# Patient Record
Sex: Female | Born: 1990 | Race: White | Hispanic: No | Marital: Single | Smoking: Never smoker
Health system: Southern US, Community
[De-identification: ages and names within clinical notes are randomized; demographics above are authoritative.]

## PROBLEM LIST (undated history)

## (undated) DIAGNOSIS — R102 Pelvic and perineal pain: Secondary | ICD-10-CM

## (undated) DIAGNOSIS — E0500 Thyrotoxicosis with diffuse goiter without thyrotoxic crisis or storm: Secondary | ICD-10-CM

## (undated) DIAGNOSIS — J45909 Unspecified asthma, uncomplicated: Secondary | ICD-10-CM

## (undated) DIAGNOSIS — E282 Polycystic ovarian syndrome: Secondary | ICD-10-CM

## (undated) HISTORY — PX: LAPAROSCOPIC ABDOMINAL EXPLORATION: SHX6249

---

## 2013-08-13 ENCOUNTER — Emergency Department: Payer: Self-pay | Admitting: Emergency Medicine

## 2013-08-13 LAB — URINALYSIS, COMPLETE
BILIRUBIN, UR: NEGATIVE
BLOOD: NEGATIVE
Glucose,UR: NEGATIVE mg/dL (ref 0–75)
KETONE: NEGATIVE
Leukocyte Esterase: NEGATIVE
NITRITE: NEGATIVE
PH: 6 (ref 4.5–8.0)
Protein: NEGATIVE
SPECIFIC GRAVITY: 1.006 (ref 1.003–1.030)
Squamous Epithelial: 2
WBC UR: 2 /HPF (ref 0–5)

## 2013-08-13 LAB — BASIC METABOLIC PANEL
Anion Gap: 6 — ABNORMAL LOW (ref 7–16)
BUN: 10 mg/dL (ref 7–18)
Calcium, Total: 9.2 mg/dL (ref 8.5–10.1)
Chloride: 107 mmol/L (ref 98–107)
Co2: 23 mmol/L (ref 21–32)
Creatinine: 0.91 mg/dL (ref 0.60–1.30)
EGFR (Non-African Amer.): >60
Glucose: 75 mg/dL (ref 65–99)
Osmolality: 270 (ref 275–301)
POTASSIUM: 4.1 mmol/L (ref 3.5–5.1)
SODIUM: 136 mmol/L (ref 136–145)

## 2013-08-13 LAB — CBC
HCT: 40.2 % (ref 35.0–47.0)
HGB: 13.2 g/dL (ref 12.0–16.0)
MCH: 29 pg (ref 26.0–34.0)
MCHC: 32.8 g/dL (ref 32.0–36.0)
MCV: 89 fL (ref 80–100)
Platelet: 241 10*3/uL (ref 150–440)
RBC: 4.55 10*6/uL (ref 3.80–5.20)
RDW: 12.1 % (ref 11.5–14.5)
WBC: 10.2 10*3/uL (ref 3.6–11.0)

## 2013-08-13 LAB — TROPONIN I: Troponin-I: <0.02 ng/mL

## 2013-09-27 ENCOUNTER — Ambulatory Visit: Payer: Self-pay | Admitting: Internal Medicine

## 2014-03-04 ENCOUNTER — Emergency Department: Payer: Self-pay | Admitting: Emergency Medicine

## 2014-03-04 LAB — COMPREHENSIVE METABOLIC PANEL
ALT: 26 U/L
Albumin: 3.5 g/dL (ref 3.4–5.0)
Alkaline Phosphatase: 37 U/L — ABNORMAL LOW
Anion Gap: 7 (ref 7–16)
BUN: 15 mg/dL (ref 7–18)
Bilirubin,Total: 0.3 mg/dL (ref 0.2–1.0)
CALCIUM: 9.4 mg/dL (ref 8.5–10.1)
CHLORIDE: 105 mmol/L (ref 98–107)
CO2: 25 mmol/L (ref 21–32)
Creatinine: 1.21 mg/dL (ref 0.60–1.30)
GFR CALC NON AF AMER: 59 — AB
Glucose: 84 mg/dL (ref 65–99)
Osmolality: 274 (ref 275–301)
Potassium: 4.1 mmol/L (ref 3.5–5.1)
SGOT(AST): 15 U/L (ref 15–37)
Sodium: 137 mmol/L (ref 136–145)
Total Protein: 8.9 g/dL — ABNORMAL HIGH (ref 6.4–8.2)

## 2014-03-04 LAB — CBC WITH DIFFERENTIAL/PLATELET
BASOS ABS: 0 10*3/uL (ref 0.0–0.1)
Basophil %: 0.3 %
Eosinophil #: 0.2 10*3/uL (ref 0.0–0.7)
Eosinophil %: 1.6 %
HCT: 41 % (ref 35.0–47.0)
HGB: 13.5 g/dL (ref 12.0–16.0)
LYMPHS ABS: 3.3 10*3/uL (ref 1.0–3.6)
LYMPHS PCT: 28.8 %
MCH: 29.7 pg (ref 26.0–34.0)
MCHC: 33 g/dL (ref 32.0–36.0)
MCV: 90 fL (ref 80–100)
Monocyte #: 0.6 x10 3/mm (ref 0.2–0.9)
Monocyte %: 5.1 %
NEUTROS ABS: 7.3 10*3/uL — AB (ref 1.4–6.5)
NEUTROS PCT: 64.2 %
Platelet: 295 10*3/uL (ref 150–440)
RBC: 4.55 10*6/uL (ref 3.80–5.20)
RDW: 12.3 % (ref 11.5–14.5)
WBC: 11.3 10*3/uL — ABNORMAL HIGH (ref 3.6–11.0)

## 2014-03-04 LAB — URINALYSIS, COMPLETE
Bacteria: NONE SEEN
Bilirubin,UR: NEGATIVE
GLUCOSE, UR: NEGATIVE mg/dL (ref 0–75)
Ketone: NEGATIVE
LEUKOCYTE ESTERASE: NEGATIVE
NITRITE: NEGATIVE
PH: 5 (ref 4.5–8.0)
Protein: 30
RBC,UR: 1 /HPF (ref 0–5)
Specific Gravity: 1.019 (ref 1.003–1.030)
Squamous Epithelial: <1
WBC UR: 1 /HPF (ref 0–5)

## 2014-03-31 ENCOUNTER — Ambulatory Visit: Payer: Self-pay | Admitting: Obstetrics and Gynecology

## 2014-03-31 LAB — BASIC METABOLIC PANEL
Anion Gap: 8 (ref 7–16)
BUN: 11 mg/dL (ref 7–18)
CO2: 25 mmol/L (ref 21–32)
Calcium, Total: 9.2 mg/dL (ref 8.5–10.1)
Chloride: 106 mmol/L (ref 98–107)
Creatinine: 1.02 mg/dL (ref 0.60–1.30)
EGFR (African American): >60
GLUCOSE: 88 mg/dL (ref 65–99)
Osmolality: 276 (ref 275–301)
POTASSIUM: 3.8 mmol/L (ref 3.5–5.1)
Sodium: 139 mmol/L (ref 136–145)

## 2014-03-31 LAB — HEMOGLOBIN: HGB: 12.2 g/dL (ref 12.0–16.0)

## 2014-04-04 ENCOUNTER — Ambulatory Visit: Payer: Self-pay | Admitting: Obstetrics and Gynecology

## 2014-05-09 ENCOUNTER — Ambulatory Visit: Payer: Self-pay | Admitting: Gastroenterology

## 2014-06-03 ENCOUNTER — Ambulatory Visit
Admit: 2014-06-03 | Disposition: A | Discharge status: Home / Self Care | Payer: Self-pay | Attending: Physician Assistant | Admitting: Physician Assistant

## 2014-06-03 ENCOUNTER — Ambulatory Visit
Admit: 2014-06-03 | Disposition: A | Discharge status: Home / Self Care | Payer: Self-pay | Attending: Family Medicine | Admitting: Family Medicine

## 2014-06-23 LAB — SURGICAL PATHOLOGY

## 2014-06-29 NOTE — Op Note (Signed)
PATIENT NAME:  Virginia Solis, Virginia Solis MR#:  [id number] DATE OF BIRTH:  04-02-1990  DATE OF PROCEDURE:  04/04/2014  PREOPERATIVE DIAGNOSIS: Pelvic pain.  POSTOPERATIVE DIAGNOSIS: Pelvic pain.  PROCEDURES PERFORMED:  1.  Diagnostic laparoscopy.  2.  Biopsy of right broad ligament.   SURGEON:  Jolinda Croakavid Goodman, MD  ANESTHESIA: General endotracheal.  ESTIMATED BLOOD LOSS: 5 mL.   URINE OUTPUT: 150 mL.   COMPLICATIONS: None.   FINDINGS: Uterus sounded to 7 cm. Benign-appearing right and left upper quadrants. Pelvic survey found hypervascularity of the anterior cul-de-sac. The right tube and ovary were benign-appearing and free of adhesive disease, normally sized. The left tube and ovary were benign-appearing and free of adhesive disease and normally sized. Both ovaries were mobile. The posterior cul-de-sac had no evidence of abnormality to speak up. Particularly the right anterior broad ligament into the right cornu were hypervascular without specific endometriotic lesions that could be excised being noted.   SPECIMEN: Biopsy of right broad ligament.   CONDITION: Stable.   DESCRIPTION OF PROCEDURE: After obtaining appropriate consent, the patient was taken to the operating room where she was placed under general anesthesia without complication. Time out was taken to insure the correct patient, the correct procedure, that her allergies were identified and prophylactic antibiotics were not required.   An open-sided speculum was placed and her cervix was identified. The uterus sounded to 7 cm. A Hulka tenaculum was placed atraumatically. Instruments were removed from the vagina and the surgeon's gloves were changed.   The umbilicus was infiltrated with 5 mL of Sensorcaine. A 5 mm incision was made in the inferior aspect of the umbilicus using an 11 blade. The abdomen was then retracted upward and a 5 mm trocar was placed atraumatically under direct visualization. Survey of the pelvis and upper abdomen  were completed and noted the findings. Next, decision was made to take a biopsy of the right broad ligament. Proper placement for the left trocar was identified. The incision site was infiltrated with 5 mL of Sensorcaine. A 5 mm incision was made and trocar was placed without complication. Next, a biopsy was taken using the biopsy forceps and the area was made hemostatic using electrosurgery. The site was irrigated and found to be hemostatic. A small amount of fluid for irrigation was sucked out using a syringe.   At this point, the procedure was terminated. The abdomen was deflated and the trocars were removed. The incisions were closed with 4-0 Monocryl and Dermabond. The Hulka tenaculum was removed and the patient was awoken from anesthesia without complication. [facility] and will be discharged home in stable condition.   ____________________________ Teena Iraniavid M. Derrill KayGoodman, MD :Terrance Masssb D: 04/04/2014 13:11:54 ET T: 04/04/2014 13:51:00 ET JOB#: [id number]  cc: Teena Iraniavid M. Derrill KayGoodman, MD, <Dictator> DAVID Farrel ConnersM GOODMAN MD ELECTRONICALLY SIGNED 04/23/2014 20:20

## 2015-05-12 ENCOUNTER — Ambulatory Visit
Admission: EM | Admit: 2015-05-12 | Discharge: 2015-05-12 | Disposition: A | Discharge status: Home / Self Care | Payer: Worker's Compensation | Attending: Family Medicine | Admitting: Family Medicine

## 2015-05-12 DIAGNOSIS — T23001A Burn of unspecified degree of right hand, unspecified site, initial encounter: Secondary | ICD-10-CM

## 2015-05-12 HISTORY — DX: Unspecified asthma, uncomplicated: J45.909

## 2015-05-12 HISTORY — DX: Polycystic ovarian syndrome: E28.2

## 2015-05-12 HISTORY — DX: Pelvic and perineal pain: R10.2

## 2015-05-12 HISTORY — DX: Thyrotoxicosis with diffuse goiter without thyrotoxic crisis or storm: E05.00

## 2015-05-12 MED ORDER — TETANUS-DIPHTH-ACELL PERTUSSIS 5-2.5-18.5 LF-MCG/0.5 IM SUSP
0.5000 mL | Freq: Once | INTRAMUSCULAR | Status: AC
  Administered 2015-05-12: 0.5 mL via INTRAMUSCULAR

## 2015-05-12 MED ORDER — SILVER SULFADIAZINE 1 % EX CREA: 1.0000 "application " | TOPICAL_CREAM | Freq: Two times a day (BID) | CUTANEOUS | Status: AC

## 2015-05-12 MED ORDER — MELOXICAM 15 MG PO TABS: 15.0000 mg | ORAL_TABLET | Freq: Every day | ORAL | Status: AC

## 2015-05-12 NOTE — Discharge Instructions (Signed)
Burn Care °Burns hurt your skin. When your skin is hurt, it is easier to get an infection. Follow your doctor's directions to help prevent an infection. °HOME CARE °· Wash your hands well before you change your bandage. °· Change your bandage as often as told by your doctor. °¨ Remove the old bandage. If the bandage sticks, soak it off with cool, clean water. °¨ Gently clean the burn with mild soap and water. °¨ Pat the burn dry with a clean, dry cloth. °¨ Put a thin layer of medicated cream on the burn. °¨ Put a clean bandage on as told by your doctor. °¨ Keep the bandage clean and dry. °· Raise (elevate) the burn for the first 24 hours. After that, follow your doctor's directions. °· Only take medicine as told by your doctor. °GET HELP RIGHT AWAY IF:  °· You have too much pain. °· The skin near the burn is red, tender, puffy (swollen), or has red streaks. °· The burn area has yellowish white fluid (pus) or a bad smell coming from it. °· You have a fever. °MAKE SURE YOU:  °· Understand these instructions. °· Will watch your condition. °· Will get help right away if you are not doing well or get worse. °  °This information is not intended to replace advice given to you by your health care provider. Make sure you discuss any questions you have with your health care provider. °  °Document Released: 11/24/2007 Document Revised: 05/09/2011 Document Reviewed: 07/07/2010 °Elsevier Interactive Patient Education ©2016 [employer] ° °

## 2015-05-12 NOTE — ED Provider Notes (Signed)
CSN: [id number]     Arrival date & time 05/12/15  1148 History   First MD Initiated Contact with Patient 05/12/15 1343    Nurses notes were reviewed.    Chief Complaint  Patient presents with  . Burn   Patient reports burning herself on a pop top at work. She put the pop tart in the microwave 1 make sure there was ready not where they was Caro LarocheBurt and at the chocolate from pop top with bruising. She wanted burning the palmar surface of her right hand. She is left-handed. Is unsure when her last tetanus was. She was sent home this happened about 5 hours ago and later she was called to come into the urgent care to be seen and evaluated and be drug tested.  Patient does not smoke. No cyst or family medical history pertinent to this burn. She's had a history of Graves' disease asthma and polycystic ovarian syndrome. (Consider location/radiation/quality/duration/timing/severity/associated sxs/prior Treatment) HPI  Past Medical History  Diagnosis Date  . Graves disease   . Asthma   . Pelvic pain in female   . PCOS (polycystic ovarian syndrome)    Past Surgical History  Procedure Laterality Date  . Laparoscopic abdominal exploration     History reviewed. No pertinent family history. Social History  Substance Use Topics  . Smoking status: Never Smoker   . Smokeless tobacco: Never Used  . Alcohol Use: No   OB History    No data available     Review of Systems  Constitutional: Negative.   All other systems reviewed and are negative.   Allergies  Vicodin and Penicillins  Home Medications   Prior to Admission medications   Medication Sig Start Date End Date Taking? Authorizing Provider  amitriptyline (ELAVIL) 25 MG tablet Take 25 mg by mouth at bedtime.   Yes Historical Provider, MD  beclomethasone (QVAR) 80 MCG/ACT inhaler Inhale 2 puffs into the lungs 2 (two) times daily.   Yes Historical Provider, MD  drospirenone-ethinyl estradiol (YASMIN,ZARAH,SYEDA) 3-0.03 MG tablet Take 1  tablet by mouth daily.   Yes Historical Provider, MD  levothyroxine (SYNTHROID, LEVOTHROID) 137 MCG tablet Take 137 mcg by mouth daily before breakfast.   Yes Historical Provider, MD  meloxicam (MOBIC) 15 MG tablet Take 1 tablet (15 mg total) by mouth daily. 05/12/15   Hassan RowanEugene Jennetta Flood, MD  silver sulfADIAZINE (SILVADENE) 1 % cream Apply 1 application topically 2 (two) times daily. 05/12/15   Hassan RowanEugene Maleea Camilo, MD   Meds Ordered and Administered this Visit   Medications  Tdap (BOOSTRIX) injection 0.5 mL (not administered)    BP 125/75 mmHg  Pulse 73  Temp(Src) 97.9 F (36.6 C) (Oral)  Resp 16  Ht 5\' 6"  (1.676 m)  Wt 232 lb (105.235 kg)  BMI 37.46 kg/m2  SpO2 100%  LMP 05/07/2015 No data found.   Physical Exam  Constitutional: She is oriented to person, place, and time. She appears well-developed and well-nourished.  HENT:  Head: Normocephalic and atraumatic.  Eyes: Conjunctivae are normal. Pupils are equal, round, and reactive to light.  Musculoskeletal: Normal range of motion. She exhibits tenderness.       Hands: Patient burned the palmar surface of her right hand over the distal metacarpal bones from losing chocolate from the pocket  Neurological: She is alert and oriented to person, place, and time.  Skin: Skin is warm and dry.  Vitals reviewed.   ED Course  Procedures (including critical care time)  Labs Review Labs Reviewed - No  data to display  Imaging Review No results found.   Visual Acuity Review  Right Eye Distance:   Left Eye Distance:   Bilateral Distance:    Right Eye Near:   Left Eye Near:    Bilateral Near:         MDM   1. Burn of hand, right, unspecified degree, initial encounter    We'll plan for patient return in 2 days a follow-up. We went to limit her use of the right hand at work for is left-handed. The other thing we will do is we will Have Her Pl., Silvadene cream on the burn area twice a day and Mobic 15 mg 1 Tablet Daily. Will Not Return  Back to Work Tomorrow Tetanus Will Also Be Ordered for Patient to Update Her Tetanus.    Hassan Rowan, MD 05/12/15 1416

## 2015-05-12 NOTE — ED Notes (Signed)
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  Patient states that she burned the inside of her right palm when she touched the top of the pop-tart.

## 2015-05-14 ENCOUNTER — Ambulatory Visit
Admission: EM | Admit: 2015-05-14 | Discharge: 2015-05-14 | Disposition: A | Discharge status: Home / Self Care | Payer: Worker's Compensation

## 2015-05-15 ENCOUNTER — Encounter: Payer: Self-pay | Admitting: *Deleted

## 2015-05-15 ENCOUNTER — Ambulatory Visit
Admission: EM | Admit: 2015-05-15 | Discharge: 2015-05-15 | Disposition: A | Discharge status: Home / Self Care | Payer: Worker's Compensation | Attending: Family Medicine | Admitting: Family Medicine

## 2015-05-15 DIAGNOSIS — T23051D Burn of unspecified degree of right palm, subsequent encounter: Secondary | ICD-10-CM | POA: Diagnosis not present

## 2015-05-15 DIAGNOSIS — Z5189 Encounter for other specified aftercare: Secondary | ICD-10-CM | POA: Diagnosis not present

## 2015-05-15 NOTE — ED Notes (Signed)
Patient is here for a workman comp follow up.

## 2015-05-15 NOTE — Discharge Instructions (Signed)
Continue home medications. Keep clean.  Rest. Drink plenty of fluids.   Follow up with your primary care physician this week as needed. Return to Urgent care for new or worsening concerns.

## 2015-05-15 NOTE — ED Provider Notes (Signed)
[facility]  ____________________________________________  Time seen: Approximately 2:03 PM  I have reviewed the triage vital signs and the nursing notes.   HISTORY  Chief Complaint Follow-up   HPI Virginia Solis is a 25 y.o. female  presents for the complaint of wound check. Patient reports that she was seen in urgent care 3 days ago after a burn to her right palm. Patient states that she was at work and heated up a pop tart up in the microwave. Patient states that she overheated the Pop Tart and when she got the pop tart out of the microwave, the inside flavoring leaked out and burned her palm. Denies other injury or burns. Denies pain.  Patient states that she is doing well and states that she is here as it was required by her job for follow-up. Patient states that she has been putting the topical antibiotic on the area as prescribed. Patient states that her right hand is not tender or painful at all. Denies any numbness or tingling sensation. Denies any restrictions of movement. 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 Patient states that she has not had any pain in the last few days with movements of hands at home or at work.  Patient states that she is doing well without any pain or complaints and states that she wants a work note stating that she can return to work as of today without any restrictions.Denies other complaints.   Reports tetanus immunization was updated at last visit.  Patient's last menstrual period was 05/07/2015. Denies chance of pregnancy.    Past Medical History  Diagnosis Date  . Graves disease   . Asthma   . Pelvic pain in female   . PCOS (polycystic ovarian syndrome)     There are no active problems to display for this patient.   Past Surgical History  Procedure Laterality Date  . Laparoscopic abdominal exploration      Current Outpatient Rx  Name  Route  Sig  Dispense  Refill  . amitriptyline  (ELAVIL) 25 MG tablet   Oral   Take 25 mg by mouth at bedtime.         . beclomethasone (QVAR) 80 MCG/ACT inhaler   Inhalation   Inhale 2 puffs into the lungs 2 (two) times daily.         . drospirenone-ethinyl estradiol (YASMIN,ZARAH,SYEDA) 3-0.03 MG tablet   Oral   Take 1 tablet by mouth daily.         Marland Kitchen. levothyroxine (SYNTHROID, LEVOTHROID) 137 MCG tablet   Oral   Take 137 mcg by mouth daily before breakfast.         . silver sulfADIAZINE (SILVADENE) 1 % cream   Topical   Apply 1 application topically 2 (two) times daily.   50 g   0   . meloxicam (MOBIC) 15 MG tablet   Oral   Take 1 tablet (15 mg total) by mouth daily.   30 tablet   0     Allergies Vicodin and Penicillins  Family History  Problem Relation Age of Onset  . Cancer Mother   . Diabetes Mother     Social History Social History  Substance Use Topics  . Smoking status: Never Smoker   . Smokeless tobacco: Never Used  . Alcohol Use: No    Review of Systems Constitutional: No fever/chills Eyes: No visual changes. ENT: No sore throat. Cardiovascular: Denies chest pain. Respiratory: Denies shortness of breath.  Gastrointestinal: No abdominal pain.  No nausea, no vomiting.  No diarrhea.  No constipation. Genitourinary: Negative for dysuria. Musculoskeletal: Negative for back pain. Skin: Negative for rash. Right palm burn.  Neurological: Negative for headaches, focal weakness or numbness.  10-point ROS otherwise negative.  ____________________________________________   PHYSICAL EXAM:  VITAL SIGNS: ED Triage Vitals  Enc Vitals Group     BP 05/15/15 1323 133/86 mmHg     Pulse Rate 05/15/15 1323 70     Resp 05/15/15 1323 18     Temp 05/15/15 1323 97.6 F (36.4 C)     Temp Source 05/15/15 1323 Oral     SpO2 05/15/15 1323 100 %     Weight 05/15/15 1323 232 lb (105.235 kg)     Height 05/15/15 1323  (1.676 m)     Head Cir --      Peak Flow --      Pain Score 05/15/15 1326 0      Pain Loc --      Pain Edu? --      Excl. in GC? --     Constitutional: Alert and oriented. Well appearing and in no acute distress. Eyes: Conjunctivae are normal. PERRL. EOMI. Head: Atraumatic.  Nose: No congestion/rhinnorhea.  Mouth/Throat: Mucous membranes are moist.  Neck: No stridor.  No cervical spine tenderness to palpation. Cardiovascular: Normal rate, regular rhythm. Grossly normal heart sounds.  Good peripheral circulation. Respiratory: Normal respiratory effort.  No retractions. Lungs CTAB. Gastrointestinal: Soft and nontender. Musculoskeletal: No lower or upper extremity tenderness nor edema.  Neurologic:  Normal speech and language. No gross focal neurologic deficits are appreciated. No gait instability. Skin:  Skin is warm, dry and intact. No rash noted. Except: Right distal Palmer at base of third and fourth fingers mild area of blanchable erythema, skin fully intact, nontender, full range of motion to right hand, no tendon or motor or sensation deficits to right hand, capillary refill less than 2 seconds to right hand all distal fingers, bilateral hand grips equal and strong, bilateral distal radial pulses equal and strong. Psychiatric: Mood and affect are normal. Speech and behavior are normal.  ____________________________________________   LABS (all labs ordered are listed, but only abnormal results are displayed)  Labs Reviewed - No data to display   INITIAL IMPRESSION / ASSESSMENT AND PLAN / ED COURSE  Pertinent labs & imaging results that were available during my care of the patient were reviewed by me and considered in my medical decision making (see chart for details).  Very well-appearing patient. No acute distress. Presenting for wound follow-up area that patient reports sustaining a burn to right palm due to a overheated Pop Tart. Patient states that she has not had any pain or discomfort. Patient requests note to go back to work without any restrictions.  Right distal Palmer with mild blanchable erythema, skin fully intact, no blistering, nontender. Encouraged patient to continue using Silvadene cream and keeping clean. Nontender, skin intact and with full range of motion, work note given. Patient to return to work today without any restrictions.  Discussed follow up with Primary care physician this week. Discussed follow up and return parameters including no resolution or any worsening concerns. Patient verbalized understanding and agreed to plan.   ____________________________________________   FINAL CLINICAL IMPRESSION(S) / ED DIAGNOSES  Final diagnoses:  Burn of right palm, unspecified degree, subsequent encounter  Visit for wound check      Note: This dictation was prepared with Dragon dictation along with  smaller phrase technology. Any transcriptional errors that result from this process are unintentional.    Renford Dills, NP 05/27/15 1255

## 2015-10-14 ENCOUNTER — Other Ambulatory Visit: Payer: Self-pay | Admitting: Obstetrics and Gynecology

## 2015-10-14 DIAGNOSIS — Z369 Encounter for antenatal screening, unspecified: Secondary | ICD-10-CM

## 2015-11-05 ENCOUNTER — Ambulatory Visit
Admission: RE | Admit: 2015-11-05 | Discharge: 2015-11-05 | Disposition: A | Discharge status: Home / Self Care | Payer: Medicaid Other | Source: Ambulatory Visit | Attending: Obstetrics and Gynecology | Admitting: Obstetrics and Gynecology

## 2015-11-05 ENCOUNTER — Ambulatory Visit (HOSPITAL_BASED_OUTPATIENT_CLINIC_OR_DEPARTMENT_OTHER)
Admission: RE | Admit: 2015-11-05 | Discharge: 2015-11-05 | Disposition: A | Discharge status: Home / Self Care | Payer: Medicaid Other | Source: Ambulatory Visit | Attending: Maternal & Fetal Medicine | Admitting: Maternal & Fetal Medicine

## 2015-11-05 ENCOUNTER — Encounter: Payer: Self-pay | Admitting: *Deleted

## 2015-11-05 DIAGNOSIS — Z36 Encounter for antenatal screening of mother: Secondary | ICD-10-CM

## 2015-11-05 DIAGNOSIS — Z369 Encounter for antenatal screening, unspecified: Secondary | ICD-10-CM | POA: Insufficient documentation

## 2015-11-05 DIAGNOSIS — Z3A13 13 weeks gestation of pregnancy: Secondary | ICD-10-CM | POA: Insufficient documentation

## 2015-11-05 DIAGNOSIS — Z3402 Encounter for supervision of normal first pregnancy, second trimester: Secondary | ICD-10-CM | POA: Insufficient documentation

## 2015-11-05 NOTE — Progress Notes (Signed)
Agree with assessment and plan [facility].

## 2015-11-05 NOTE — Progress Notes (Signed)
Referring phyisician:  [facility] Length of Consultation: 40 minutes   Virginia Solis  was referred to [facility] for genetic counseling to review prenatal screening and testing options.  This note summarizes the information we discussed.    We offered the following routine screening tests for this pregnancy:  First trimester screening, which includes nuchal translucency ultrasound screen and first trimester maternal serum marker screening.  The nuchal translucency has approximately an 80% detection rate for Down syndrome and can be positive for other chromosome abnormalities as well as congenital heart defects.  When combined with a maternal serum marker screening, the detection rate is up to 90% for Down syndrome and up to 97% for trisomy 18.     Maternal serum marker screening, a blood test that measures pregnancy proteins, can provide risk assessments for Down syndrome, trisomy 18, and open neural tube defects (spina bifida, anencephaly). Because it does not directly examine the fetus, it cannot positively diagnose or rule out these problems.  Targeted ultrasound uses high frequency sound waves to create an image of the developing fetus.  An ultrasound is often recommended as a routine means of evaluating the pregnancy.  It is also used to screen for fetal anatomy problems (for example, a heart defect) that might be suggestive of a chromosomal or other abnormality.   Should these screening tests indicate an increased concern, then the following additional testing options would be offered:  The chorionic villus sampling procedure is available for first trimester chromosome analysis.  This involves the withdrawal of a small amount of chorionic villi (tissue from the developing placenta).  Risk of pregnancy loss is estimated to be approximately 1 in 200 to 1 in 100 (0.5 to 1%).  There is approximately a 1% (1 in 100) chance that the CVS chromosome results will be  unclear.  Chorionic villi cannot be tested for neural tube defects.     Amniocentesis involves the removal of a small amount of amniotic fluid from the sac surrounding the fetus with the use of a thin needle inserted through the maternal abdomen and uterus.  Ultrasound guidance is used throughout the procedure.  Fetal cells from amniotic fluid are directly evaluated and > 99.5% of chromosome problems and > 98% of open neural tube defects can be detected. This procedure is generally performed after the 15th week of pregnancy.  The main risks to this procedure include complications leading to miscarriage in less than 1 in 200 cases (0.5%).  As another option for information if the pregnancy is suspected to be an an increased chance for certain chromosome conditions, we also reviewed the availability of cell free fetal DNA testing from maternal blood to determine whether or not the baby may have either Down syndrome, trisomy 4, or trisomy 55.  This test utilizes a maternal blood sample and DNA sequencing technology to isolate circulating cell free fetal DNA from maternal plasma.  The fetal DNA can then be analyzed for DNA sequences that are derived from the three most common chromosomes involved in aneuploidy, chromosomes 13, 18, and 21.  If the overall amount of DNA is greater than the expected level for any of these chromosomes, aneuploidy is suspected.  While we do not consider it a replacement for invasive testing and karyotype analysis, a negative result from this testing would be reassuring, though not a guarantee of a normal chromosome complement for the baby.  An abnormal result is certainly suggestive of an abnormal chromosome complement,  though we would still recommend CVS or amniocentesis to confirm any findings from this testing.   Cystic Fibrosis and Spinal Muscular Atrophy (SMA) screening were also discussed with the patient. Both conditions are recessive, which means that both parents must be  carriers in order to have a child with the disease.  Cystic fibrosis (CF) is one of the most common genetic conditions in persons of Caucasian ancestry.  This condition occurs in approximately 1 in 2,500 Caucasian persons and results in thickened secretions in the lungs, digestive, and reproductive systems.  For a baby to be at risk for having CF, both of the parents must be carriers for this condition.  Approximately 1 in 5625 Caucasian persons is a carrier for CF.  Current carrier testing looks for the most common mutations in the gene for CF and can detect approximately 90% of carriers in the Caucasian population.  This means that the carrier screening can greatly reduce, but cannot eliminate, the chance for an individual to have a child with CF.  If an individual is found to be a carrier for CF, then carrier testing would be available for the partner. As part of [place]'s newborn screening profile, all babies born in the state of [place] will have a two-tier screening process.  Specimens are first tested to determine the concentration of immunoreactive trypsinogen (IRT).  The top 5% of specimens with the highest IRT values then undergo DNA testing using a panel of over 40 common CF mutations. SMA is a neurodegenerative disorder that leads to atrophy of skeletal muscle and overall weakness.  This condition is also more prevalent in the Caucasian population, with 1 in 40-1 in 60 persons being a carrier and 1 in 6,000-1 in 10,000 children being affected.  There are multiple forms of the disease, with some causing death in infancy to other forms with survival into adulthood.  The genetics of SMA is complex, but carrier screening can detect up to 95% of carriers in the Caucasian population.  Similar to CF, a negative result can greatly reduce, but cannot eliminate, the chance to have a child with SMA.  We obtained a detailed family history and pregnancy history.  Both the patient and her husband,  Virginia Solis, reported strong family histories of diabetes and hypertension.  We reviewed the fact that these conditions are likely due to a combination of genetic and lifestyle factors. Virginia Solis stated that several members of his family had learning disabilities in school, including himself, his sister and one niece.  His father also has several children from other relationships including four sons with one girlfriend, 3 of whom have developmental differences such as autism, Asperger and ADHD.  We talked about various causes for intellectual disabilies and learning differences including multifactorial conditions, genetic syndromes such as Fragile X syndrome, chromosome conditions or other or unknown causes.  We would encourage a formal genetics evaluation for Hector's half siblings.  He is aware that his children may be more likely to need special education help at school given his history, though a specific recurrence chance is difficult to say. The remainder of the family history was reported to be unremarkable for birth defects, mental retardation, recurrent pregnancy loss or known chromosome abnormalities.  Virginia Solis stated that this is her first pregnancy.  She reported taking several medications of nausea prescribed by her doctor during this pregnancy.  She also has an inhaler for asthma which she uses as needed.  Lastly, she has been prescribed amitriptyline for pelvic  pain which she took until [redacted] weeks gestation.  Some studies have suggested that this medication may be associated with an increased risk for birth defects, particularly limb defects, but this has not been confirmed in other studies. There has also been concern for withdrawal symptoms in babies exposure late in gestation.  We cannot comment on the possible interactive effects of multiple medications and encourage the patient to stay in contact with her doctor about necessary medications in pregnancy.  After consideration of the options, Virginia Solis  elected to proceed with first trimester screening and to decline CF and SMA carrier testing.  An ultrasound was performed at the time of the visit.  The gestational age was consistent with  13 weeks.  Fetal anatomy could not be assessed due to early gestational age.  Please refer to the ultrasound report for details of that study.  Virginia Solis was encouraged to call with questions or concerns.  We can be contacted at [phone].    Cherly Anderson, MS, CGC

## 2015-11-09 ENCOUNTER — Telehealth: Payer: Self-pay | Admitting: Obstetrics and Gynecology

## 2015-11-09 NOTE — Telephone Encounter (Signed)
  Ms. Virginia Solis elected to undergo First Trimester screening on 11/05/2015.  To review, first trimester screening, includes nuchal translucency ultrasound screen and/or first trimester maternal serum marker screening.  The nuchal translucency has approximately an 80% detection rate for Down syndrome and can be positive for other chromosome abnormalities as well as heart defects.  When combined with a maternal serum marker screening, the detection rate is up to 90% for Down syndrome and up to 97% for trisomy 13 and 18.     The results of the First Trimester Nuchal Translucency and Biochemical Screening were within normal range.  The risk for Down syndrome is now estimated to be less than 1 in 10,000.  The risk for Trisomy 13/18 is also estimated to be less than 1 in 10,000.  Should more definitive information be desired, we would offer amniocentesis.  Because we do not yet know the effectiveness of combined first and second trimester screening, we do not recommend a maternal serum screen to assess the chance for chromosome conditions.  However, if screening for neural tube defects is desired, maternal serum screening for AFP only can be performed between 15 and [redacted] weeks gestation.      Cherly Andersoneborah F. Duvid Smalls, MS, CGC

## 2015-12-17 NOTE — Progress Notes (Signed)
Patient seen by me.  History reviewed.  I agree with the recommendations outlined in CGC Wells's note.   

## 2016-05-30 IMAGING — CT CT HEAD WITHOUT CONTRAST
1 series · 16 of 28 positions shown, 20 images · non-contrast
Comparison: None.

CLINICAL DATA: Trauma, posterior head injury, weakness, pain

EXAM:
CT HEAD WITHOUT CONTRAST
TECHNIQUE: Contiguous axial images were obtained from the base of the skull
through the vertex without intravenous contrast.

[Series 2: head wo · axial · 0.42mm/px · z∈[-11,+114]mm · 16 of 28 slices shown, 20 images]
[im 2/28  brain]
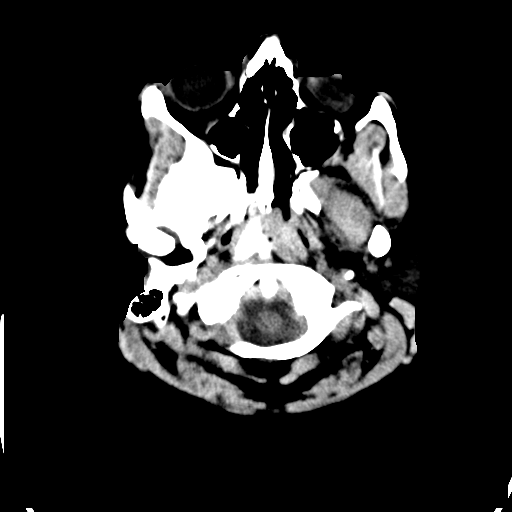
[im 2/28  bone]
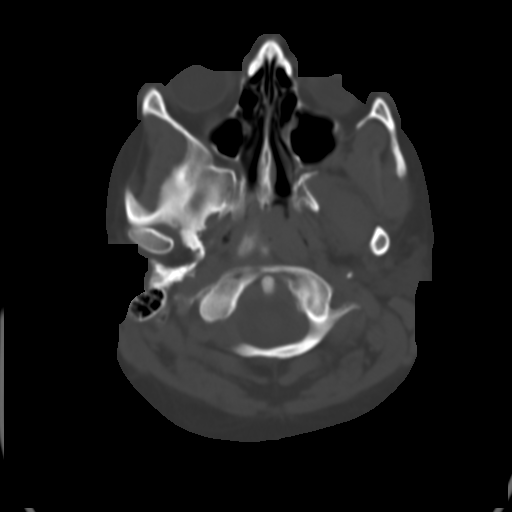
[im 4/28  brain]
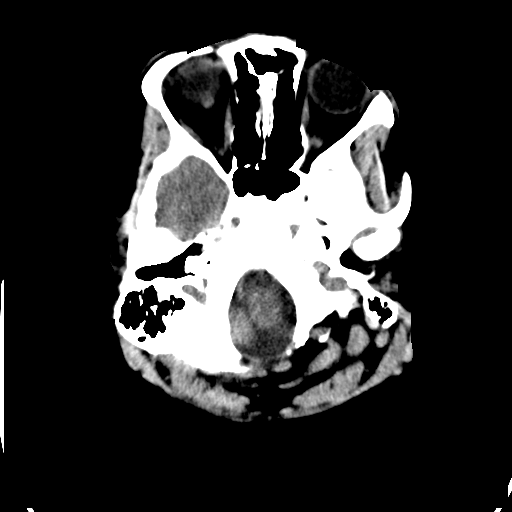
[im 6/28  brain]
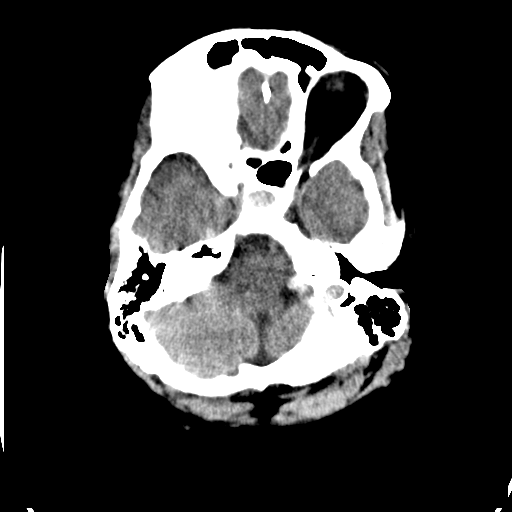
[im 7/28  brain]
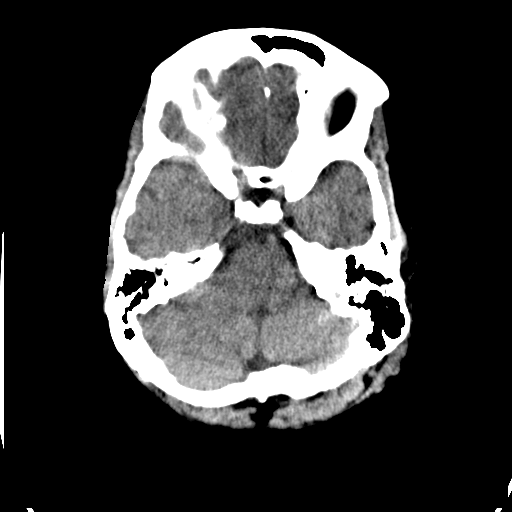
[im 9/28  brain]
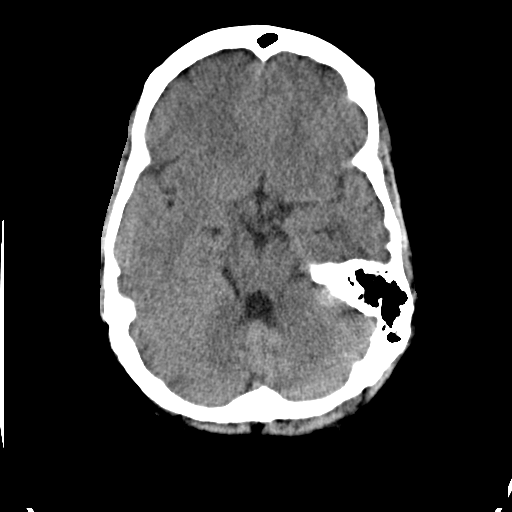
[im 9/28  bone]
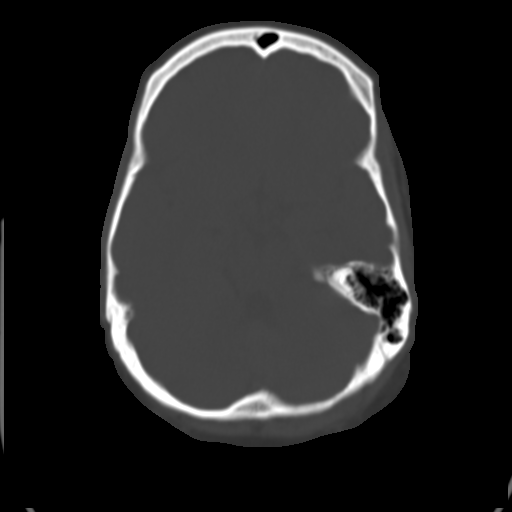
[im 10/28  brain]
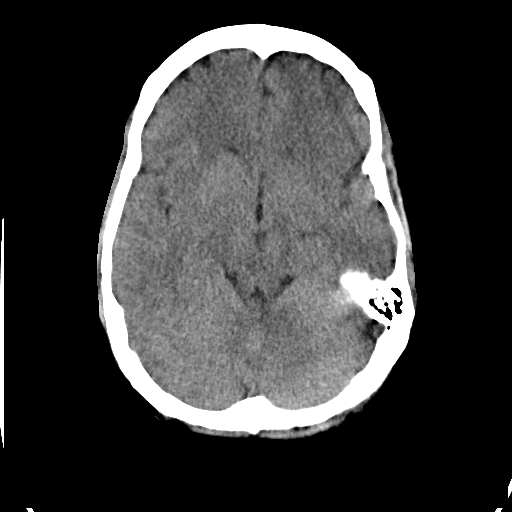
[im 12/28  brain]
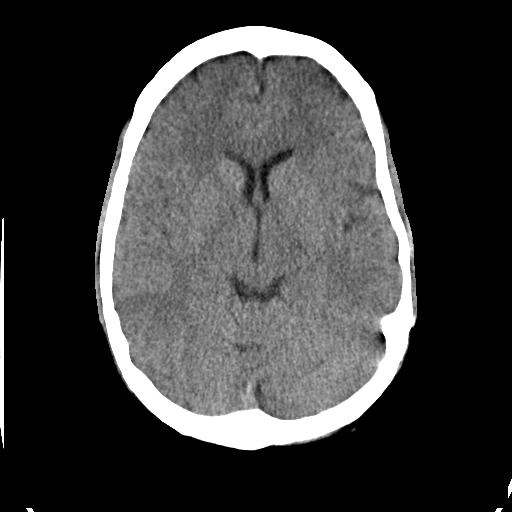
[im 14/28  brain]
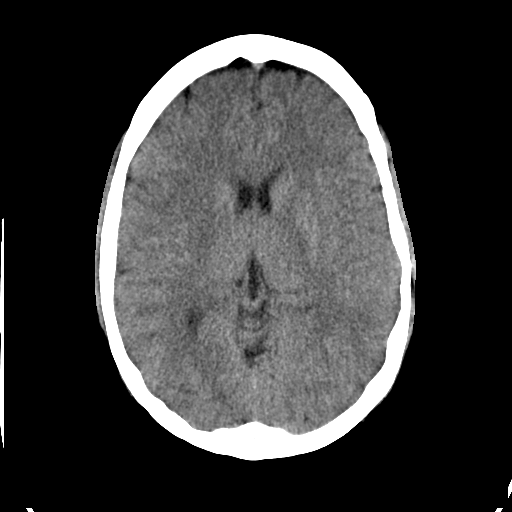
[im 15/28  brain]
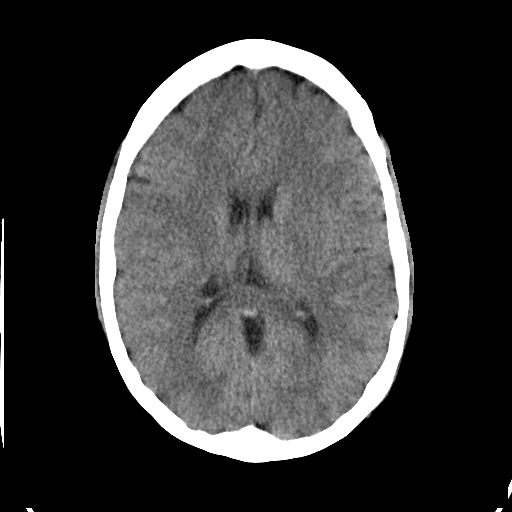
[im 15/28  bone]
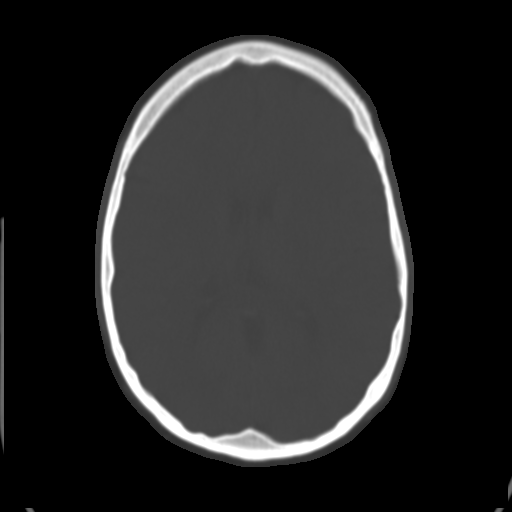
[im 17/28  brain]
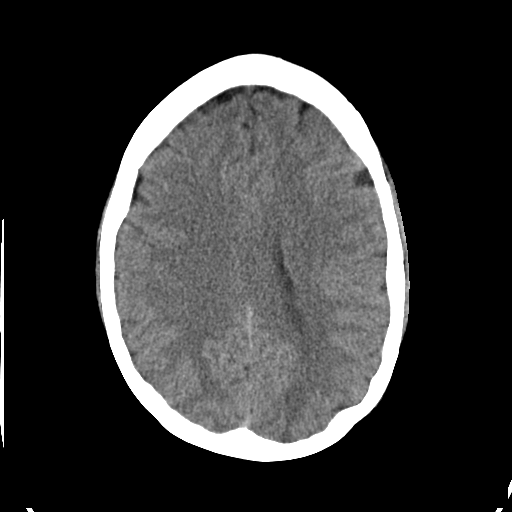
[im 19/28  brain]
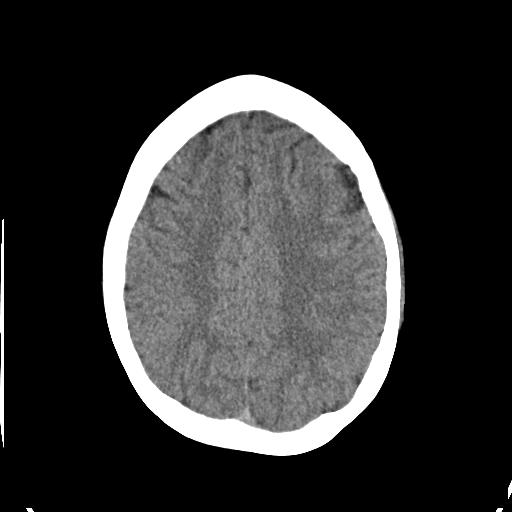
[im 20/28  brain]
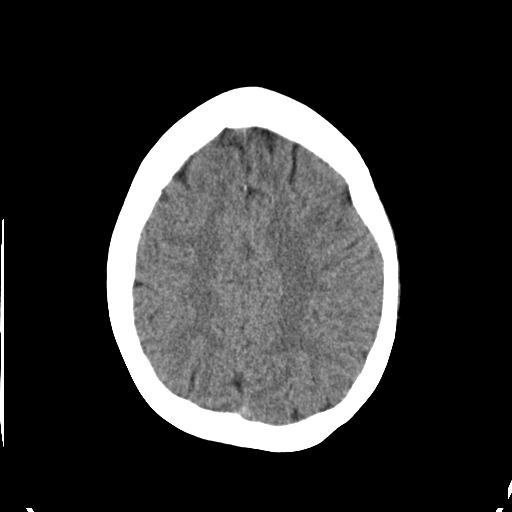
[im 22/28  brain]
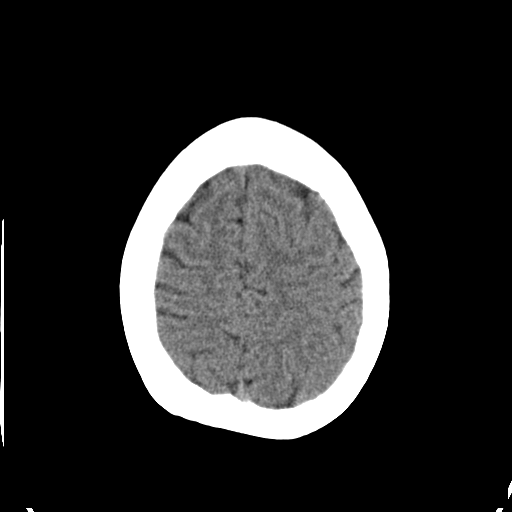
[im 22/28  bone]
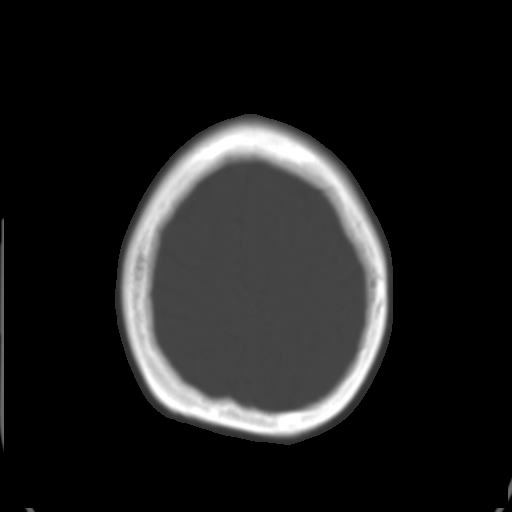
[im 23/28  brain]
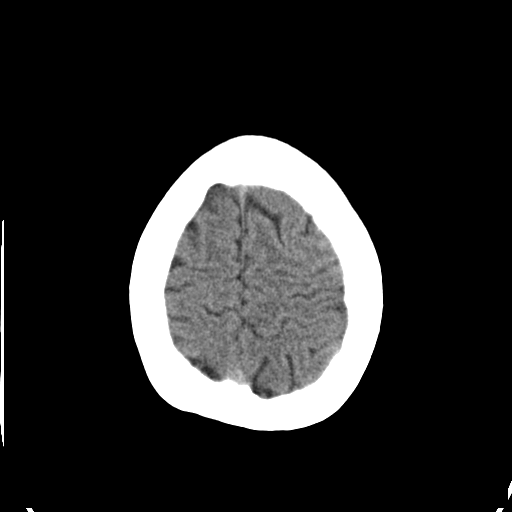
[im 25/28  brain]
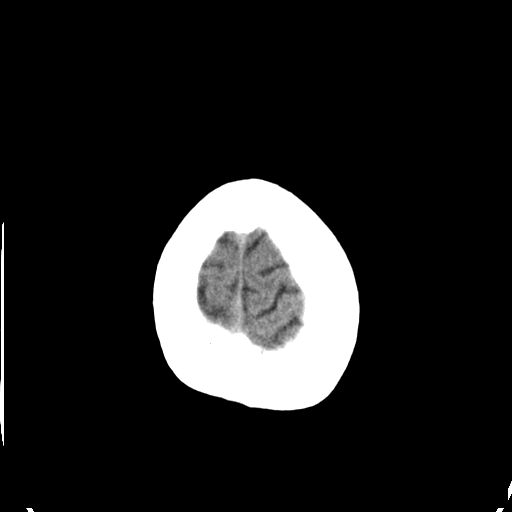
[im 27/28  brain]
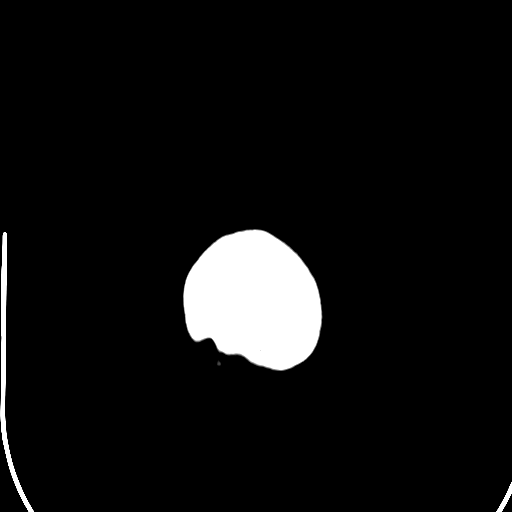

[16 of 28 positions shown; findings below may reference images not displayed]

FINDINGS: No evidence of parenchymal hemorrhage or extra-axial fluid
collection. No mass lesion, mass effect, or midline shift.

No CT evidence of acute infarction.

Cerebral volume is within normal limits.  No ventriculomegaly.

The visualized paranasal sinuses are essentially clear. The mastoid
air cells are unopacified.

No evidence of calvarial fracture.
IMPRESSION: Normal head CT.

## 2016-09-09 ENCOUNTER — Encounter (HOSPITAL_COMMUNITY): Payer: Self-pay

## 2018-03-19 IMAGING — US US MFM FETAL NUCHAL TRANSLUCENCY
1 series · 13 of 28 positions shown · non-contrast
Comparison: none

PATIENT INFO:

PERFORMED BY:
SERVICE(S) PROVIDED:
INDICATIONS:
13 weeks gestation of pregnancy
First trimester screening
FETAL EVALUATION:
Num Of Fetuses:     1
Fetal Heart         168
Rate(bpm):
Cardiac Activity:   Present
Presentation:       Variable
Placenta:           Anterior
BIOMETRY:
CRL:      67.6  mm     G. Age:  13w 0d                  EDD:    05/12/16
GESTATIONAL AGE:
Best:          13w 0d     Det. By:  Early Ultrasound         EDD:   05/12/16
(09/24/15)
1ST TRIMESTER GENETIC SONOGRAM SCREENING:
CRL:            67.6  mm    G. Age:   13w 0d                 EDD:   05/12/16
ANATOMY:
Choroid Plexus:        Within Normal Limits   Upper Extremities:      Visualized
Stomach:               Seen                   Lower Extremities:      Visualized
Bladder:               Seen
CERVIX UTERUS ADNEXA:
Right Ovary
Size(cm)       4.1  x   1.2    x  1.3       Vol(ml):

[Series 1: us mfm fetal nuchal translucency · 0.20mm/px · 13 of 48 slices shown]
[im 2/48]
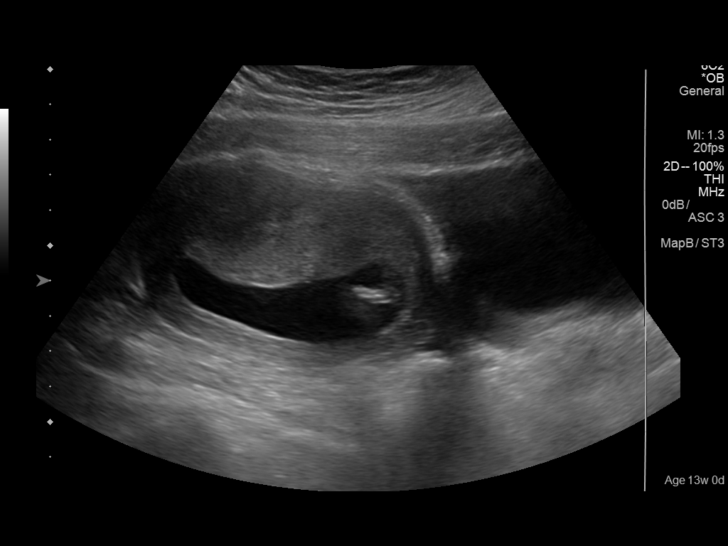
[im 6/48]
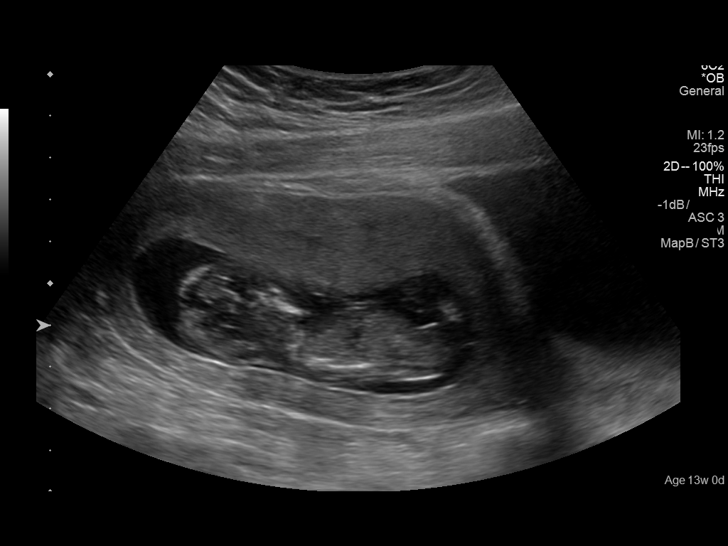
[im 9/48]
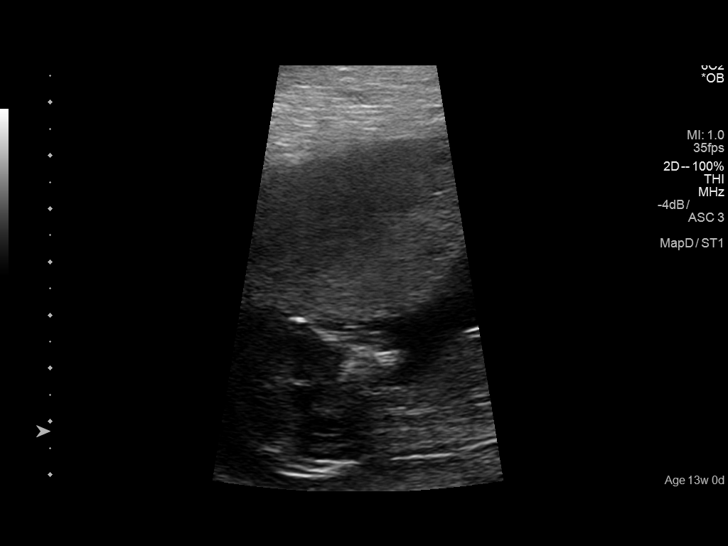
[im 13/48]
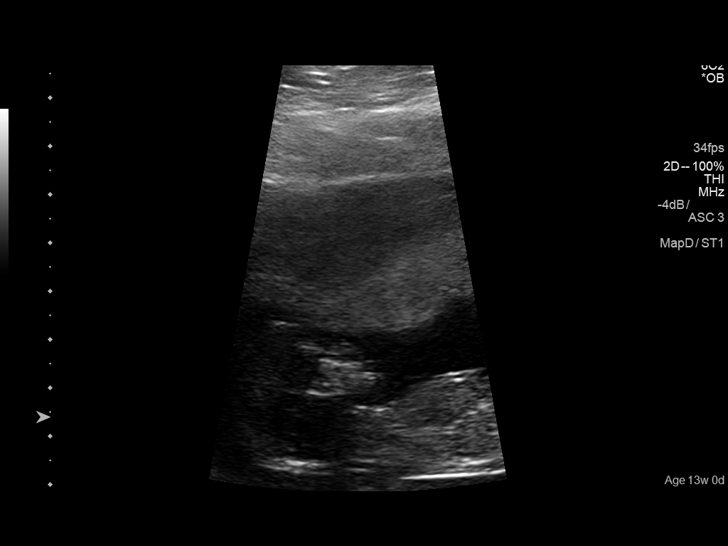
[im 16/48]
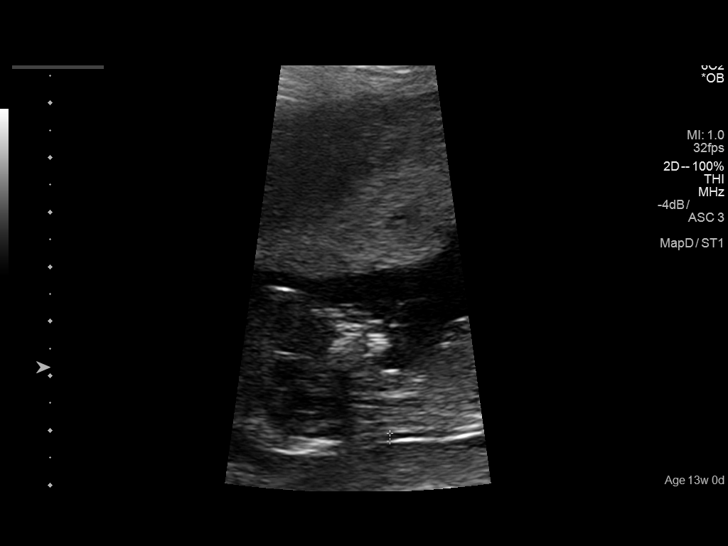
[im 20/48]
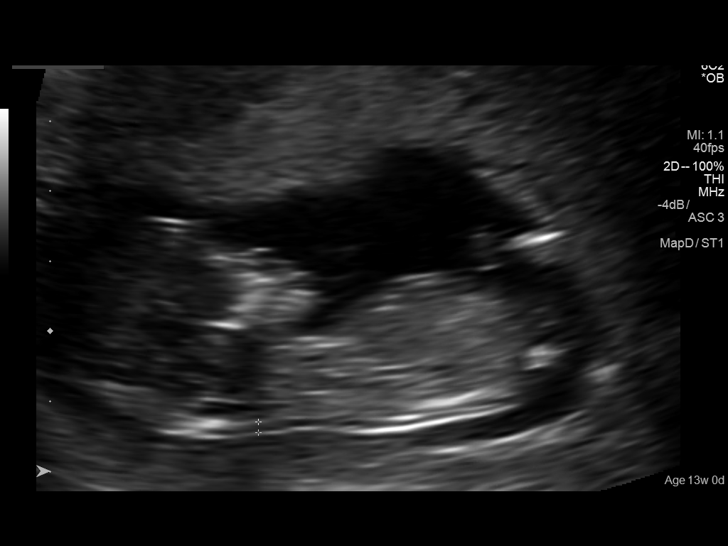
[im 25/48]
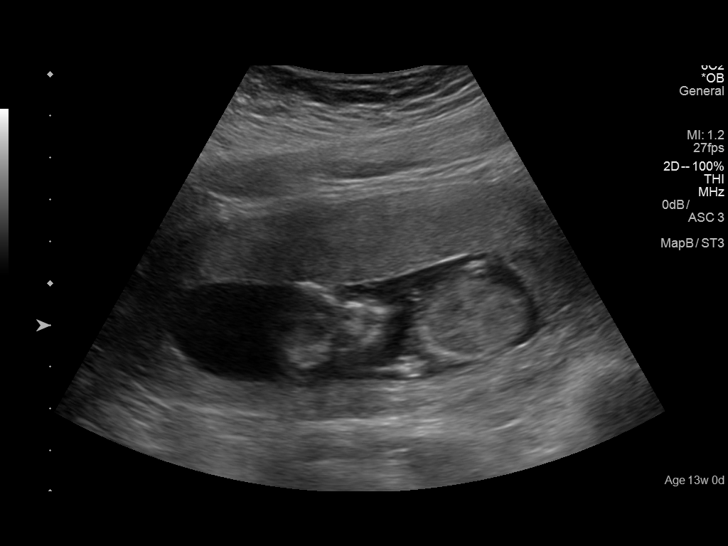
[im 28/48]
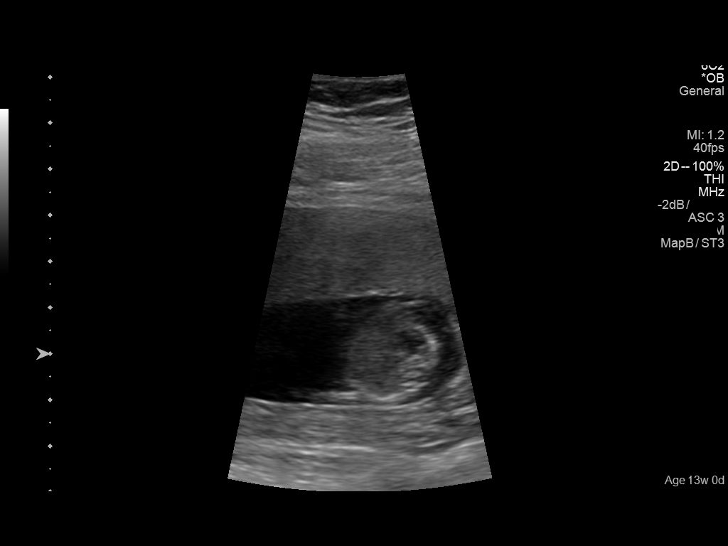
[im 32/48]
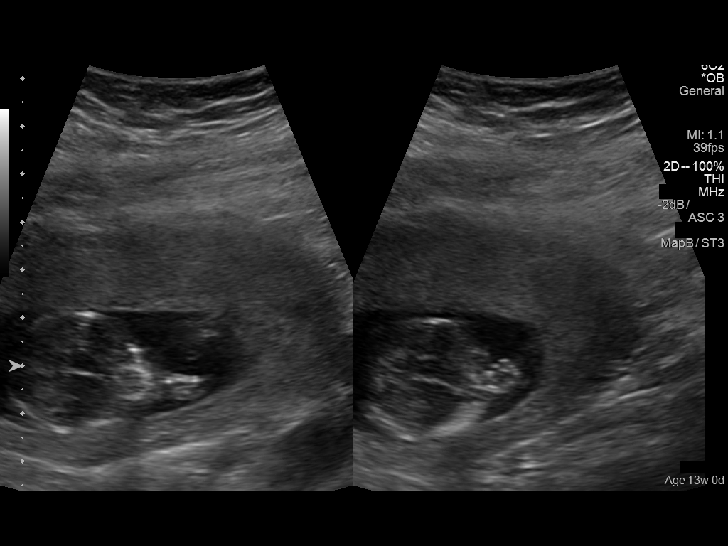
[im 35/48]
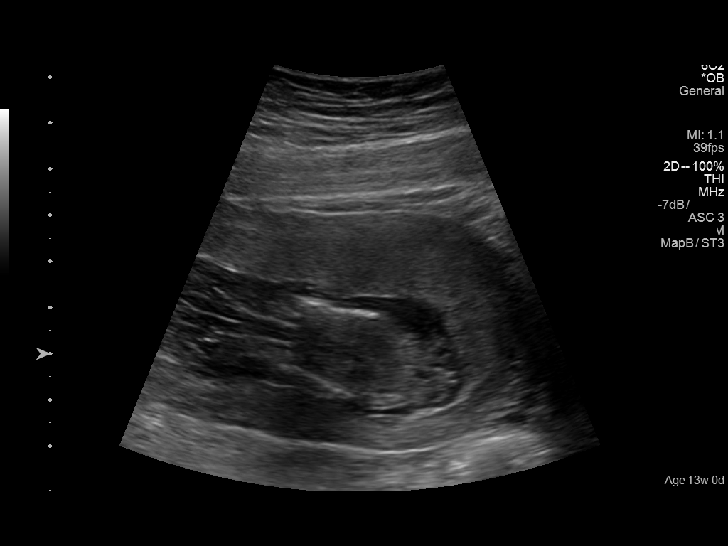
[im 39/48]
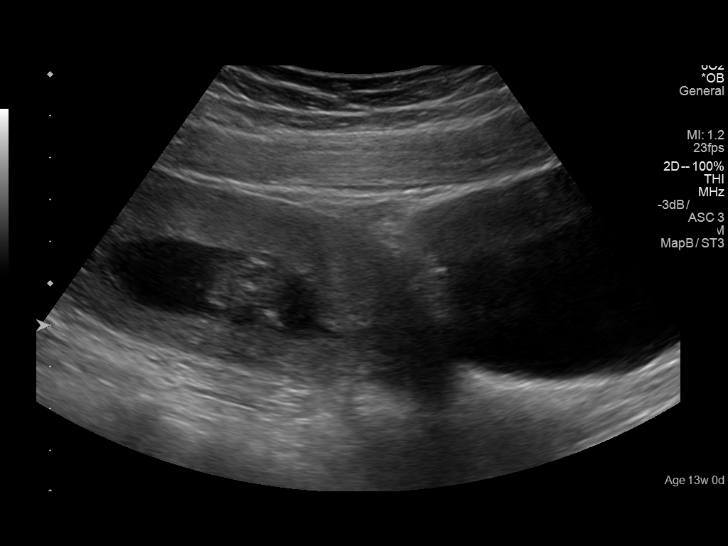
[im 42/48]
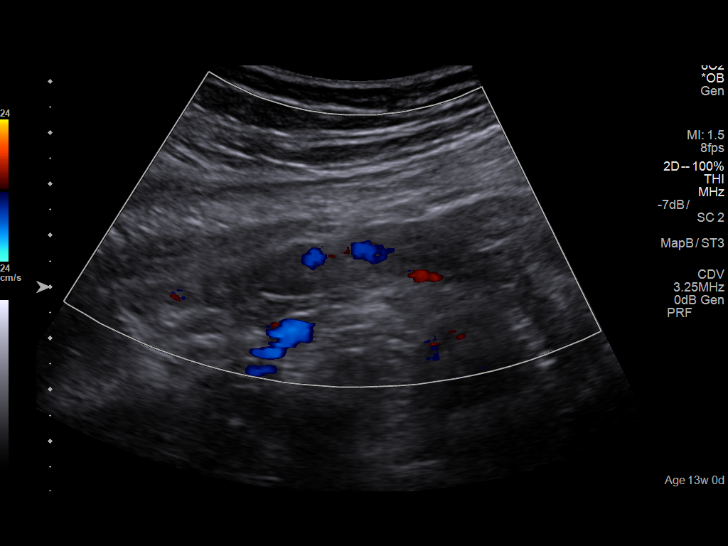
[im 46/48]
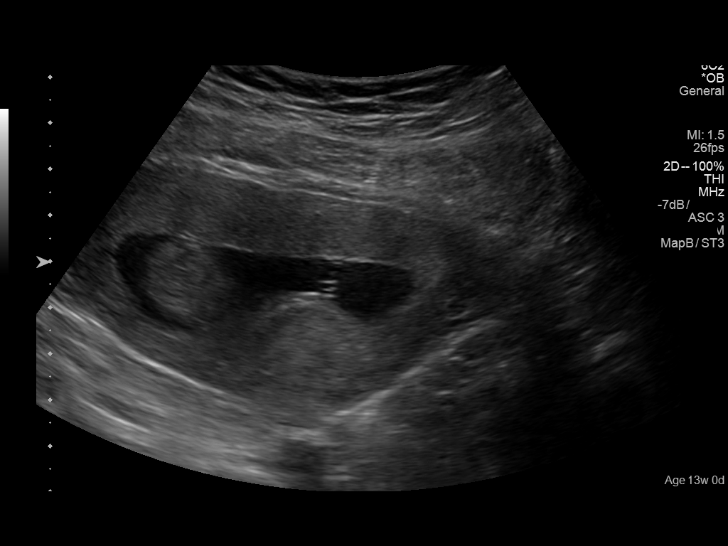

[13 of 28 positions shown; findings below may reference images not displayed]

IMPRESSION: Thank you for referring your patient for first trimester
screening.  She had the opportunity to meet with our genetic
counselor to discuss aneuploidy screening and testing
options today.  Please see that letter for details.  Dating is by
ultrasound performed at [REDACTED] on 09/24/2015 due to
unsure LMP; measurements were consistent with 7 weeks 0
days.

A single, live intrauterine gestation is noted.  The best NT
measurement is     1.5mm.

The right ovary appears normal, the left ovary was not well
visualized.  The first trimester fetal limbs, symmetric choroid
plexus,  bladder, and stomach are seen.  The gestational age
is too early for a complete anatomic survey.

Follow-up exam was scheduled in two weeks  for nuchal
translucency ultrasound.

Thank you for allowing us to participate in your patient's care.
assistance
